# Patient Record
Sex: Male | Born: 1950 | Race: White | Hispanic: No | Marital: Married | Smoking: Never smoker
Health system: Southern US, Community
[De-identification: ages and names within clinical notes are randomized; demographics above are authoritative.]

## PROBLEM LIST (undated history)

## (undated) DIAGNOSIS — E7800 Pure hypercholesterolemia, unspecified: Secondary | ICD-10-CM

## (undated) HISTORY — PX: CORONARY ANGIOPLASTY WITH STENT PLACEMENT: SHX49

## (undated) HISTORY — DX: Pure hypercholesterolemia, unspecified: E78.00

## (undated) HISTORY — PX: VEIN BYPASS SURGERY: SHX833

---

## 2011-03-19 ENCOUNTER — Other Ambulatory Visit (HOSPITAL_COMMUNITY): Payer: Self-pay | Admitting: Specialist

## 2011-03-19 ENCOUNTER — Ambulatory Visit (HOSPITAL_COMMUNITY)
Admission: RE | Admit: 2011-03-19 | Discharge: 2011-03-19 | Disposition: A | Discharge status: Home / Self Care | Payer: Worker's Compensation | Source: Ambulatory Visit | Attending: Specialist | Admitting: Specialist

## 2011-03-19 DIAGNOSIS — M25519 Pain in unspecified shoulder: Secondary | ICD-10-CM | POA: Insufficient documentation

## 2019-09-01 ENCOUNTER — Ambulatory Visit (INDEPENDENT_AMBULATORY_CARE_PROVIDER_SITE_OTHER): Payer: Medicare Other | Admitting: Orthopaedic Surgery

## 2019-09-01 ENCOUNTER — Other Ambulatory Visit: Payer: Self-pay

## 2019-09-01 ENCOUNTER — Ambulatory Visit: Payer: Self-pay

## 2019-09-01 ENCOUNTER — Encounter: Payer: Self-pay | Admitting: Orthopaedic Surgery

## 2019-09-01 VITALS — Ht 65.0 in | Wt 185.0 lb

## 2019-09-01 DIAGNOSIS — M25562 Pain in left knee: Secondary | ICD-10-CM

## 2019-09-01 DIAGNOSIS — G8929 Other chronic pain: Secondary | ICD-10-CM | POA: Diagnosis not present

## 2019-09-01 DIAGNOSIS — M170 Bilateral primary osteoarthritis of knee: Secondary | ICD-10-CM | POA: Diagnosis not present

## 2019-09-01 NOTE — Progress Notes (Signed)
Office Visit Note   Patient: Jonathan Moran           Date of Birth: 1950-06-19           MRN: [id number] Visit Date: 09/01/2019              Requested by: No referring provider defined for this encounter. PCP: No primary care provider on file.   Assessment & Plan: Visit Diagnoses:  1. Chronic pain of left knee   2. Bilateral primary osteoarthritis of knee     Plan: Mr. Decock relates having pain in both knees his history is well outlined below.  He said Workmen's Comp. injuries bilaterally but many years ago.  He has evidence of osteoarthritis on plain film and I suspect that is the cause of his present pain.  I have discussed use of NSAIDs, braces, Voltaren gel and cortisone and/or viscosupplementation.  Has had cortisone in the past without much relief.  I do not think he be a good candidate for total knee replacement at this point but certainly something to think about.  I have given him a lot of information is going to go home and think about all the above and let us know if he wants to proceed with further nonoperative treatment or even consider an MRI scan of his left knee which is the more symptomatic.  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  His biggest problem is typically getting up and down from a squatting position.  Also have discussed physical therapy and strengthening exercises  Follow-Up Instructions: Return if symptoms worsen or fail to improve.   Orders:  Orders Placed This Encounter  Procedures  . XR KNEE 3 VIEW LEFT   No orders of the defined types were placed in this encounter.     Procedures: No procedures performed   Clinical Data: No additional findings.   Subjective: Chief Complaint  Patient presents with  . Left Knee - Pain  Patient presents today for left knee pain. He said that he injured his left knee at work 5 years ago. He slipped and fell getting off a loader and landed on his left knee. After imaging  at the time it was recommended to have his knee scoped and cleaned out, but it was never done. His pain is located medially. The pain is not constant, but seems to hurt more when walking on uneven ground. It does not give way, swell, or grind. He takes Ibuprofen for pain. Patient wants to get back to hiking.  Had a cortisone injection years ago without much relief.  Also had an injury to his right knee about 6 years ago that was Workmen's Comp. related.  He is not sure that it was ever diagnosed but "never had surgery".  In the interim he developed a problem with his back and was treated nonoperatively as well. He is semiretired and works with heavy equipment and does not appear to have much compromise of his activities.  He does have difficulty with deep knee bends and squats.  He specifically does not have a sensation of his knee giving way but oftentimes will have a feeling like a "ice pick" in one of the other knee.  He has worn knee braces which may provide some relief of his discomfort.  He is not had any effusion.  HPI  Review of Systems   Objective: Vital Signs: Ht 5\' 5"  (1.651 m)   Wt 185 lb (  83.9 kg)   BMI 30.79 kg/m   Physical Exam Constitutional:      Appearance: He is well-developed.  Eyes:     Pupils: Pupils are equal, round, and reactive to light.  Pulmonary:     Effort: Pulmonary effort is normal.  Skin:    General: Skin is warm and dry.  Neurological:     Mental Status: He is alert and oriented to person, place, and time.  Psychiatric:        Behavior: Behavior normal.     Ortho Exam awake alert and oriented x3.  Comfortable sitting.  Stiff related to his back when he gets up from a sitting position but does not appear to limp in reference to either knee.  The knees were not hot warm or swollen.  No effusion.  He had anterior medial joint pain bilaterally and some patella crepitation and minimal pain with patella compression.  He had little difficulty with deep knee  bends and squats in the office.  No popliteal mass or pain.  No calf pain.  No distal edema.  Full extension of flexed over 105 degrees.  No opening with a varus or valgus stress in either knee.  The left knee had a positive anterior drawer sign and positive Lachman's.  I would not be surprised if he had an injury to his ACL many years ago but is not having any sensation of his knee giving way. Specialty Comments:  No specialty comments available.  Imaging: XR KNEE 3 VIEW LEFT  Result Date: 09/01/2019 Films of both knees were obtained in the AP and lateral projections with patella views.  On the left there is narrowing of the medial joint space with mild subchondral sclerosis and minimal osteophyte formation.  No ectopic calcification.  There is some narrowing of the lateral patellofemoral articulation.  No vascular calcification.  Alignment is neutral.  Right knee films were included in the series and are similar to the left knee with some narrowing of the medial joint space with subchondral sclerosis and a very small peripheral proximal tibial subchondral cyst.  Films are consistent with osteoarthritis    PMFS History: Patient Active Problem List   Diagnosis Date Noted  . Bilateral primary osteoarthritis of knee 09/01/2019   Past Medical History:  Diagnosis Date  . High cholesterol     No family history on file.  Past Surgical History:  Procedure Laterality Date  . CORONARY ANGIOPLASTY WITH STENT PLACEMENT    . VEIN BYPASS SURGERY     Social History   Occupational History  . Not on file  Tobacco Use  . Smoking status: Never Smoker  . Smokeless tobacco: Never Used  Substance and Sexual Activity  . Alcohol use: Never  . Drug use: Never  . Sexual activity: Not on file

## 2019-09-11 ENCOUNTER — Other Ambulatory Visit: Payer: Self-pay | Admitting: Radiology

## 2019-09-11 ENCOUNTER — Other Ambulatory Visit: Payer: Self-pay | Admitting: Orthopaedic Surgery

## 2019-09-11 ENCOUNTER — Telehealth: Payer: Self-pay | Admitting: Radiology

## 2019-09-11 DIAGNOSIS — G8929 Other chronic pain: Secondary | ICD-10-CM

## 2019-09-11 DIAGNOSIS — Z77018 Contact with and (suspected) exposure to other hazardous metals: Secondary | ICD-10-CM

## 2019-09-11 NOTE — Telephone Encounter (Signed)
Thank you :)

## 2019-09-11 NOTE — Telephone Encounter (Signed)
Patient left voicemail on Sabrina's phone requesting MRI.  I have placed order for left knee MRI per Dr. Serita Grit last note.

## 2019-10-02 ENCOUNTER — Ambulatory Visit
Admission: RE | Admit: 2019-10-02 | Discharge: 2019-10-02 | Disposition: A | Discharge status: Home / Self Care | Payer: Medicare Other | Source: Ambulatory Visit | Attending: Orthopaedic Surgery | Admitting: Orthopaedic Surgery

## 2019-10-02 ENCOUNTER — Other Ambulatory Visit: Payer: Self-pay

## 2019-10-02 DIAGNOSIS — G8929 Other chronic pain: Secondary | ICD-10-CM

## 2019-10-02 DIAGNOSIS — Z77018 Contact with and (suspected) exposure to other hazardous metals: Secondary | ICD-10-CM

## 2019-10-02 DIAGNOSIS — M25562 Pain in left knee: Secondary | ICD-10-CM

## 2019-10-06 ENCOUNTER — Ambulatory Visit: Payer: Medicare Other | Admitting: Orthopaedic Surgery

## 2019-10-06 ENCOUNTER — Other Ambulatory Visit: Payer: Self-pay

## 2019-10-06 ENCOUNTER — Encounter: Payer: Self-pay | Admitting: Orthopaedic Surgery

## 2019-10-06 DIAGNOSIS — M25561 Pain in right knee: Secondary | ICD-10-CM | POA: Insufficient documentation

## 2019-10-06 DIAGNOSIS — G8929 Other chronic pain: Secondary | ICD-10-CM | POA: Diagnosis not present

## 2019-10-06 DIAGNOSIS — M25562 Pain in left knee: Secondary | ICD-10-CM | POA: Diagnosis not present

## 2019-10-06 NOTE — Progress Notes (Signed)
Office Visit Note   Patient: Jonathan Moran           Date of Birth: 1950/08/28           MRN: [id number] Visit Date: 10/06/2019              Requested by: No referring provider defined for this encounter. PCP: Katherine Basset, PA-C   Assessment & Plan: Visit Diagnoses:  1. Chronic pain of left knee     Plan: Mr. Fullen relates that he had an on-the-job injury about 4 to 5 years ago to his left knee.  He was treated conservatively.  He notes that he has had recurrent episodes of pain popping and clicking particularly along the medial compartment.  Accordingly, I ordered an MRI scan.  The scan reveals a large horizontal tear through the posterior horn and body of the medial meniscus.  The body is degenerative diminutive and extruded peripherally.  He also has a horizontal tear in the posterior horn of the lateral meniscus and a second horizontal tear in the mid body.  He had minimal degeneration patellofemoral joint and mild degeneration in both medial lateral compartments without popliteal cyst.  No bony acute problems or stress reaction.  Long discussion regarding diagnosis and treatment options.  I would suggest scopic debridement of the medial lateral menisci to evaluate the arthritis.  He certainly has some arthritis in that knee caused him to have some residual discomfort but given his activity level I think the arthroscopy would be his best bet.  He is fully aware that this may not provide complete relief.  He relates he has a lot of activities that he has to complete before considering the surgery and will call Debbie when he is ready to schedule  Discussed with surgery, outpatient nature what he may expect postoperatively as well as some of the potential complications and pain.  Follow-Up Instructions: Return if symptoms worsen or fail to improve.   Orders:  No orders of the defined types were placed in this encounter.  No orders of the defined types were placed in this  encounter.     Procedures: No procedures performed   Clinical Data: No additional findings.   Subjective: Chief Complaint  Patient presents with  . Left Knee - Follow-up    MRI review  Patient presents today for follow up on his left knee. He had an MRI done on 10/02/2019. No changes since his last visit.   HPI  Review of Systems  Constitutional: Negative for fatigue.  HENT: Negative for ear pain.   Eyes: Negative for pain.  Respiratory: Negative for shortness of breath.   Cardiovascular: Negative for leg swelling.  Gastrointestinal: Negative for constipation and diarrhea.  Endocrine: Negative for cold intolerance and heat intolerance.  Genitourinary: Negative for difficulty urinating.  Musculoskeletal: Negative for joint swelling.  Skin: Negative for rash.  Allergic/Immunologic: Negative for food allergies.  Neurological: Negative for weakness.  Hematological: Does not bruise/bleed easily.  Psychiatric/Behavioral: Negative for sleep disturbance.     Objective: Vital Signs: Ht 5\' 5"  (1.651 m)   Wt 185 lb (83.9 kg)   BMI 30.79 kg/m   Physical Exam Constitutional:      Appearance: He is well-developed.  Eyes:     Pupils: Pupils are equal, round, and reactive to light.  Pulmonary:     Effort: Pulmonary effort is normal.  Skin:    General: Skin is warm and dry.  Neurological:     Mental Status: He  is alert and oriented to person, place, and time.  Psychiatric:        Behavior: Behavior normal.     Ortho Exam left knee was not hot red warm or swollen. Full extension.  No effusion.  No instability.  No popliteal mass or pain.  No calf pain.  No distal edema.  He did have medial and lateral joint pain but more so in the lateral.  I could not discharge positive medial margins.  No instability with an anterior drawer sign.  Very mild patella crepitation but no particular pain.  He did have a small plica that was minimally uncomfortable.  Specialty Comments:  No  specialty comments available.  Imaging: No results found.   PMFS History: Patient Active Problem List   Diagnosis Date Noted  . Pain in left knee 10/06/2019  . Bilateral primary osteoarthritis of knee 09/01/2019   Past Medical History:  Diagnosis Date  . High cholesterol     History reviewed. No pertinent family history.  Past Surgical History:  Procedure Laterality Date  . CORONARY ANGIOPLASTY WITH STENT PLACEMENT    . VEIN BYPASS SURGERY     Social History   Occupational History  . Not on file  Tobacco Use  . Smoking status: Never Smoker  . Smokeless tobacco: Never Used  Substance and Sexual Activity  . Alcohol use: Never  . Drug use: Never  . Sexual activity: Not on file

## 2019-11-30 ENCOUNTER — Telehealth: Payer: Self-pay

## 2019-11-30 ENCOUNTER — Telehealth: Payer: Self-pay | Admitting: Orthopaedic Surgery

## 2019-11-30 NOTE — Telephone Encounter (Signed)
Patient would like to proceed with knee surgery. He states the right knee is bothering him more, so he would like to start with right knee first.  Please provide surgery sheet.

## 2019-11-30 NOTE — Telephone Encounter (Signed)
patient called he wants to follow through with having surgery on the right leg in January and surgery of left leg in February. [phone]

## 2019-12-01 NOTE — Telephone Encounter (Signed)
Completed.

## 2019-12-01 NOTE — Telephone Encounter (Signed)
Placed in folder for you to fill out.

## 2020-01-19 ENCOUNTER — Telehealth: Payer: Self-pay | Admitting: Orthopaedic Surgery

## 2020-01-19 NOTE — Telephone Encounter (Signed)
called

## 2020-01-19 NOTE — Telephone Encounter (Signed)
Patient called to reschedule his right knee surgery due to the potential threat of ice and snow this week.  Patient has to drive from [place] and roads are still bad there.  Patient has moved from  Jan 20th to Feb 3rd at [facility].  Please call patient and advise if he will be on crutches or in need of walker.   pts cb  [phone]

## 2020-01-19 NOTE — Telephone Encounter (Signed)
Please advise. Thanks.  

## 2020-01-28 ENCOUNTER — Inpatient Hospital Stay: Payer: Medicare Other | Admitting: Orthopaedic Surgery

## 2020-02-02 ENCOUNTER — Telehealth: Payer: Self-pay | Admitting: Orthopaedic Surgery

## 2020-02-02 NOTE — Telephone Encounter (Signed)
Please advise 

## 2020-02-02 NOTE — Telephone Encounter (Signed)
PT called wanting to know if Dr.Whitfield could give him an estimate of when he'll be able to start putting weight back on his knee after surgery? Pt would like a CB to discuss  [phone]

## 2020-02-02 NOTE — Telephone Encounter (Signed)
called

## 2020-02-04 ENCOUNTER — Other Ambulatory Visit: Payer: Self-pay | Admitting: Orthopaedic Surgery

## 2020-02-04 DIAGNOSIS — M23251 Derangement of posterior horn of lateral meniscus due to old tear or injury, right knee: Secondary | ICD-10-CM

## 2020-02-04 DIAGNOSIS — M23221 Derangement of posterior horn of medial meniscus due to old tear or injury, right knee: Secondary | ICD-10-CM

## 2020-02-04 MED ORDER — HYDROCODONE-ACETAMINOPHEN 5-325 MG PO TABS: 1.0000 | ORAL_TABLET | Freq: Four times a day (QID) | ORAL | 0 refills | Status: AC | PRN

## 2020-02-05 ENCOUNTER — Telehealth: Payer: Self-pay

## 2020-02-05 NOTE — Telephone Encounter (Signed)
called

## 2020-02-05 NOTE — Telephone Encounter (Signed)
Patients son called he stated his father was prescribed hydrocodone which isn't touching the pain he stated his dad has been up all night and is wondering if something different can be prescribed or a higher dosage of hydrocodone. [phone]

## 2020-02-05 NOTE — Telephone Encounter (Signed)
See below

## 2020-02-11 ENCOUNTER — Encounter: Payer: Self-pay | Admitting: Orthopaedic Surgery

## 2020-02-11 ENCOUNTER — Ambulatory Visit (INDEPENDENT_AMBULATORY_CARE_PROVIDER_SITE_OTHER): Payer: Medicare Other | Admitting: Orthopaedic Surgery

## 2020-02-11 ENCOUNTER — Other Ambulatory Visit: Payer: Self-pay

## 2020-02-11 VITALS — Ht 65.0 in | Wt 185.0 lb

## 2020-02-11 DIAGNOSIS — G8929 Other chronic pain: Secondary | ICD-10-CM

## 2020-02-11 DIAGNOSIS — M25561 Pain in right knee: Secondary | ICD-10-CM

## 2020-02-11 NOTE — Progress Notes (Signed)
   Office Visit Note   Patient: Jonathan Moran           Date of Birth: Mar 16, 1950           MRN: [id number] Visit Date: 02/11/2020              Requested by: Katherine Basset, PA-C [address] [place],  [place] [address] PCP: Katherine Basset, PA-C   Assessment & Plan: Visit Diagnoses:  1. Chronic pain of right knee     Plan: 1 week status post right knee arthroscopy.  There were tears of the medial lateral menisci which were debrided.  Also some areas of grade 4 arthritis in the medial compartment.  Actually doing well.  Using a walker for ambulation but feels confident he could progress to a cane without a problem.  Taking very little Vicodin.  We will plan to see him back in 2 weeks.  He will work on range of motion exercises of bear weight to tolerance.  At this point I do not think he will need therapy  Follow-Up Instructions: Return in about 2 weeks (around 02/25/2020).   Orders:  No orders of the defined types were placed in this encounter.  No orders of the defined types were placed in this encounter.     Procedures: No procedures performed   Clinical Data: No additional findings.   Subjective: Chief Complaint  Patient presents with  . Right Knee - Follow-up    Right knee arthroscopy 02/04/2020  Patient presents today for follow up on his right knee. He had a right knee arthroscopy with medial and lateral meniscectomy and chondroplasty of the medial femoral condyle. His surgery was on 02/04/2020, and he is now one week out from surgery. Patient states that he is finally starting to feel better. He had intense pain when his block wore off and had a difficult time catching up. He has finished taking his pain medicine. He is walking with a walker for support.   HPI  Review of Systems   Objective: Vital Signs: Ht 5\' 5"  (1.651 m)   Wt 185 lb (83.9 kg)   BMI 30.79 kg/m   Physical Exam  Ortho Exam right knee was not hot red or warm minimally swollen and  minimal effusion.  Arthroscopic portals are healing without problem.  No distal edema.  No calf pain.  Full extension flexed at least 95 to 100 degrees  Specialty Comments:  No specialty comments available.  Imaging: No results found.   PMFS History: Patient Active Problem List   Diagnosis Date Noted  . Pain in left knee 10/06/2019  . Bilateral primary osteoarthritis of knee 09/01/2019   Past Medical History:  Diagnosis Date  . High cholesterol     History reviewed. No pertinent family history.  Past Surgical History:  Procedure Laterality Date  . CORONARY ANGIOPLASTY WITH STENT PLACEMENT    . VEIN BYPASS SURGERY     Social History   Occupational History  . Not on file  Tobacco Use  . Smoking status: Never Smoker  . Smokeless tobacco: Never Used  Substance and Sexual Activity  . Alcohol use: Never  . Drug use: Never  . Sexual activity: Not on file

## 2020-03-02 ENCOUNTER — Ambulatory Visit: Payer: Medicare Other | Admitting: Orthopaedic Surgery

## 2020-03-02 ENCOUNTER — Encounter: Payer: Self-pay | Admitting: Orthopaedic Surgery

## 2020-03-02 ENCOUNTER — Other Ambulatory Visit: Payer: Self-pay

## 2020-03-02 VITALS — Ht 65.0 in | Wt 185.0 lb

## 2020-03-02 DIAGNOSIS — M170 Bilateral primary osteoarthritis of knee: Secondary | ICD-10-CM

## 2020-03-02 DIAGNOSIS — G8929 Other chronic pain: Secondary | ICD-10-CM

## 2020-03-02 DIAGNOSIS — M25562 Pain in left knee: Secondary | ICD-10-CM | POA: Diagnosis not present

## 2020-03-02 MED ORDER — LIDOCAINE HCL 1 % IJ SOLN
2.0000 mL | INTRAMUSCULAR | Status: AC | PRN
  Administered 2020-03-02: 2 mL

## 2020-03-02 NOTE — Progress Notes (Signed)
Office Visit Note   Patient: Jonathan Moran           Date of Birth: May 30, 1950           MRN: [id number] Visit Date: 03/02/2020              Requested by: Katherine Basset, PA-C [address] [place],  [place] [address] PCP: Katherine Basset, PA-C   Assessment & Plan: Visit Diagnoses:  1. Chronic pain of left knee   2. Bilateral primary osteoarthritis of knee     Plan: 1 month status post right knee arthroscopy with partial medial lateral meniscectomy.  Also noted to have some arthritic changes particularly in the medial compartment.  Has had some tightness and stiffness of his knee which is probably related to the arthritis but also related to an effusion.  I aspirated 40 cc of blood-tinged fluid with considerable relief of his pain and increased range of motion.  Have encouraged him to work with range of motion exercises and return in a month if no improvement.  Consider cortisone injection at that time  Follow-Up Instructions: Return if symptoms worsen or fail to improve.   Orders:  Orders Placed This Encounter  Procedures  . Large Joint Inj: R knee   No orders of the defined types were placed in this encounter.     Procedures: Large Joint Inj: R knee on 03/02/2020 10:05 AM Indications: pain and diagnostic evaluation Details: 25 G 1.5 in needle, anteromedial approach  Arthrogram: No  Medications: 2 mL lidocaine 1 % Aspirate: 40 mL blood-tinged Outcome: tolerated well, no immediate complications Procedure, treatment alternatives, risks and benefits explained, specific risks discussed. Consent was given by the patient. Immediately prior to procedure a time out was called to verify the correct patient, procedure, equipment, support staff and site/side marked as required. Patient was prepped and draped in the usual sterile fashion.       Clinical Data: No additional findings.   Subjective: Chief Complaint  Patient presents with  . Right Knee - Follow-up     Right knee arthroscopy 02/04/2020  Patient presents today for follow up on his right knee. He had a right knee arthroscopy on 02/04/2020. He is now 4 weeks out from surgery. Patient states that he is doing okay. He has stiffness in his knee with any prolonged sitting or standing. He states that the medial incision is still very sore. He is not taking anything for pain.  HPI  Review of Systems   Objective: Vital Signs: Ht 5\' 5"  (1.651 m)   Wt 185 lb (83.9 kg)   BMI 30.79 kg/m   Physical Exam  Ortho Exam positive effusion right knee.  No particular increase in warmth.  No instability.  Minimal medial joint pain anteriorly.  Full extension flex to over 105 degrees without instability  Specialty Comments:  No specialty comments available.  Imaging: No results found.   PMFS History: Patient Active Problem List   Diagnosis Date Noted  . Pain in left knee 10/06/2019  . Bilateral primary osteoarthritis of knee 09/01/2019   Past Medical History:  Diagnosis Date  . High cholesterol     History reviewed. No pertinent family history.  Past Surgical History:  Procedure Laterality Date  . CORONARY ANGIOPLASTY WITH STENT PLACEMENT    . VEIN BYPASS SURGERY     Social History   Occupational History  . Not on file  Tobacco Use  . Smoking status: Never Smoker  . Smokeless tobacco: Never  Used  Substance and Sexual Activity  . Alcohol use: Never  . Drug use: Never  . Sexual activity: Not on file

## 2020-04-21 ENCOUNTER — Other Ambulatory Visit: Payer: Self-pay

## 2020-04-21 ENCOUNTER — Encounter: Payer: Self-pay | Admitting: Orthopaedic Surgery

## 2020-04-21 ENCOUNTER — Ambulatory Visit (INDEPENDENT_AMBULATORY_CARE_PROVIDER_SITE_OTHER): Payer: Medicare Other | Admitting: Orthopaedic Surgery

## 2020-04-21 VITALS — Ht 65.0 in | Wt 185.0 lb

## 2020-04-21 DIAGNOSIS — M170 Bilateral primary osteoarthritis of knee: Secondary | ICD-10-CM

## 2020-04-21 DIAGNOSIS — G8929 Other chronic pain: Secondary | ICD-10-CM

## 2020-04-21 DIAGNOSIS — M25561 Pain in right knee: Secondary | ICD-10-CM

## 2020-04-21 NOTE — Progress Notes (Signed)
   Office Visit Note   Patient: Jonathan Moran           Date of Birth: 1950-05-15           MRN: [id number] Visit Date: 04/21/2020              Requested by: Katherine Basset, PA-C [address] [place],  [place] [address] PCP: Katherine Basset, PA-C   Assessment & Plan: Visit Diagnoses:  1. Chronic pain of right knee   2. Bilateral primary osteoarthritis of knee     Plan: Jonathan Moran is 2 and half months status post right knee arthroscopy with partial meniscectomy and debridement of medial compartment arthritis.  Actually doing fairly well.  Does have some limitations with relative to the arthritis but happy with the results.  He does not use any ambulatory aid or take any pain pills.  Long discussion regarding exercises for strengthening and maybe even wearing a pullover knee support as needed.  We will plan to see him back as needed.  He is fully aware that he will will have some limitations and occasional discomfort based on the arthritis  Follow-Up Instructions: Return if symptoms worsen or fail to improve.   Orders:  No orders of the defined types were placed in this encounter.  No orders of the defined types were placed in this encounter.     Procedures: No procedures performed   Clinical Data: No additional findings.   Subjective: Chief Complaint  Patient presents with  . Right Knee - Follow-up    Right knee arthroscopy 02/04/2020  Patient presents today for follow up on his right knee. He had a right knee arthroscopy on 02/04/2020. Patient states that it is very swollen medially. He has difficulty with stairs. He is not taking anything for pain. Minimal swelling per patient.  HPI  Review of Systems   Objective: Vital Signs: Ht 5\' 5"  (1.651 m)   Wt 185 lb (83.9 kg)   BMI 30.79 kg/m   Physical Exam  Ortho Exam right knee was not warm or hot.  No effusion.  Little bit of medial joint tenderness but no crepitation.  Full extension of flexed over 100  degrees without instability.  No calf pain.  No distal edema  Specialty Comments:  No specialty comments available.  Imaging: No results found.   PMFS History: Patient Active Problem List   Diagnosis Date Noted  . Pain in left knee 10/06/2019  . Bilateral primary osteoarthritis of knee 09/01/2019   Past Medical History:  Diagnosis Date  . High cholesterol     History reviewed. No pertinent family history.  Past Surgical History:  Procedure Laterality Date  . CORONARY ANGIOPLASTY WITH STENT PLACEMENT    . VEIN BYPASS SURGERY     Social History   Occupational History  . Not on file  Tobacco Use  . Smoking status: Never Smoker  . Smokeless tobacco: Never Used  Substance and Sexual Activity  . Alcohol use: Never  . Drug use: Never  . Sexual activity: Not on file

## 2020-04-27 ENCOUNTER — Encounter: Payer: Medicare Other | Admitting: Orthopaedic Surgery

## 2022-02-08 IMAGING — CR DG ORBITS FOR FOREIGN BODY
2 series · 2 of 2 positions shown · non-contrast
Comparison: 03/19/2011

CLINICAL DATA: Metal working/exposure; clearance prior to MRI

EXAM:
ORBITS FOR FOREIGN BODY - 2 VIEW

[w orbit pa (1 of 2)]
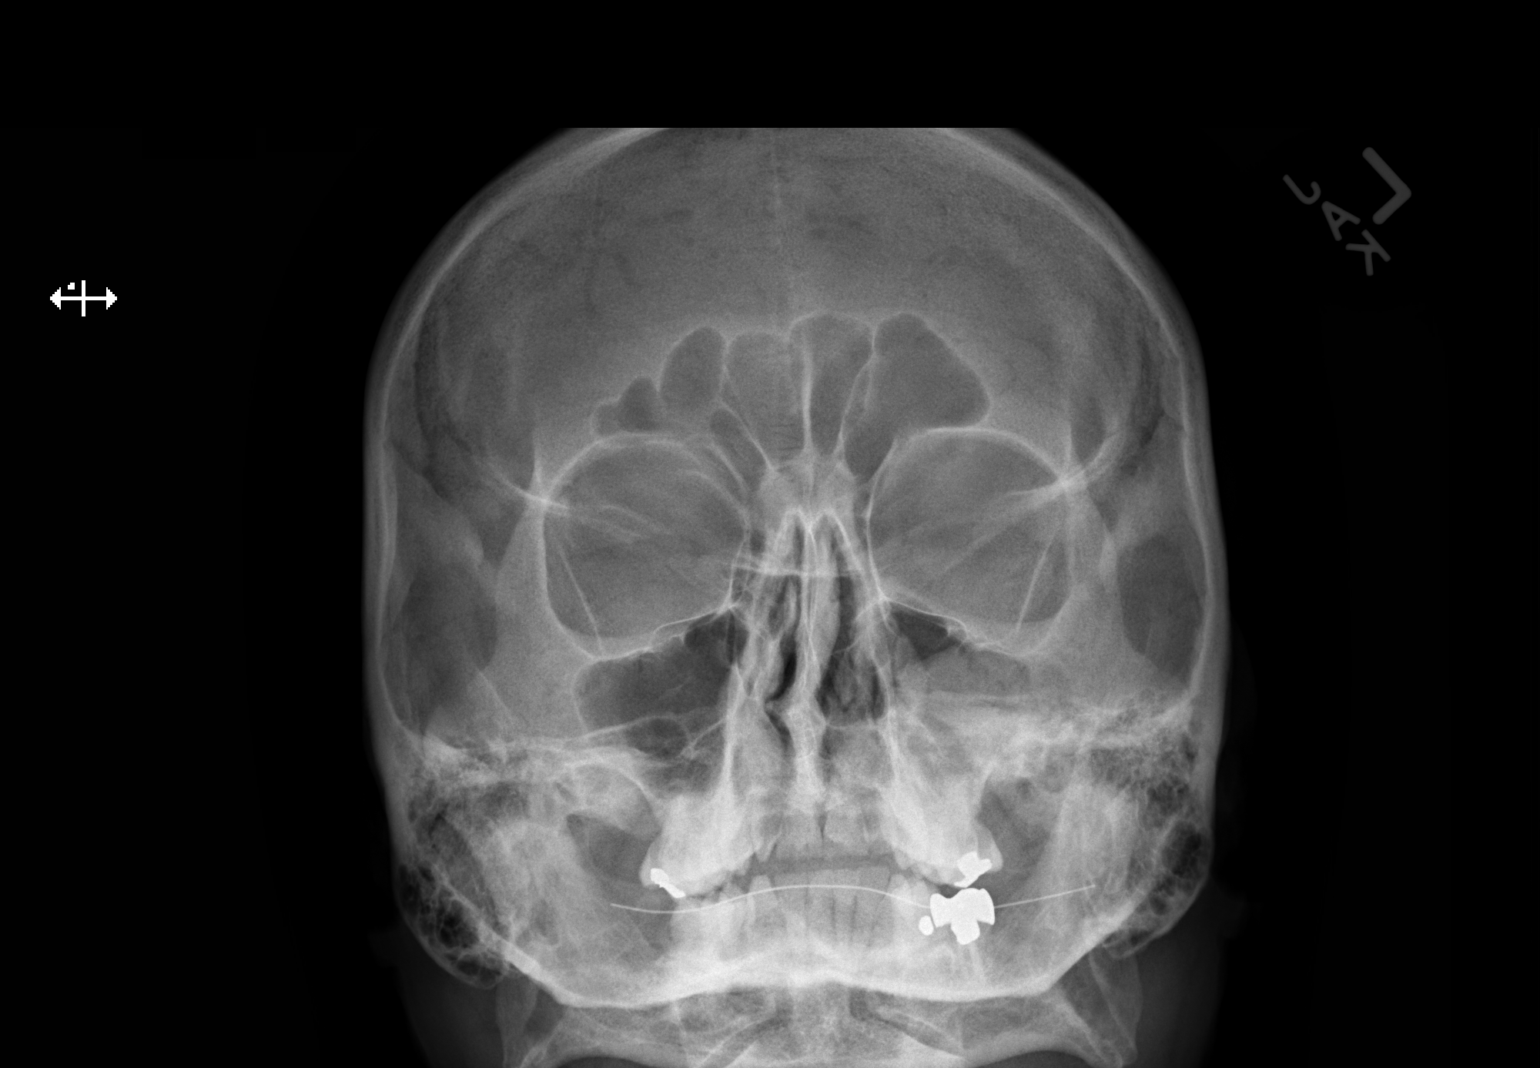

[w orbit pa (2 of 2)]
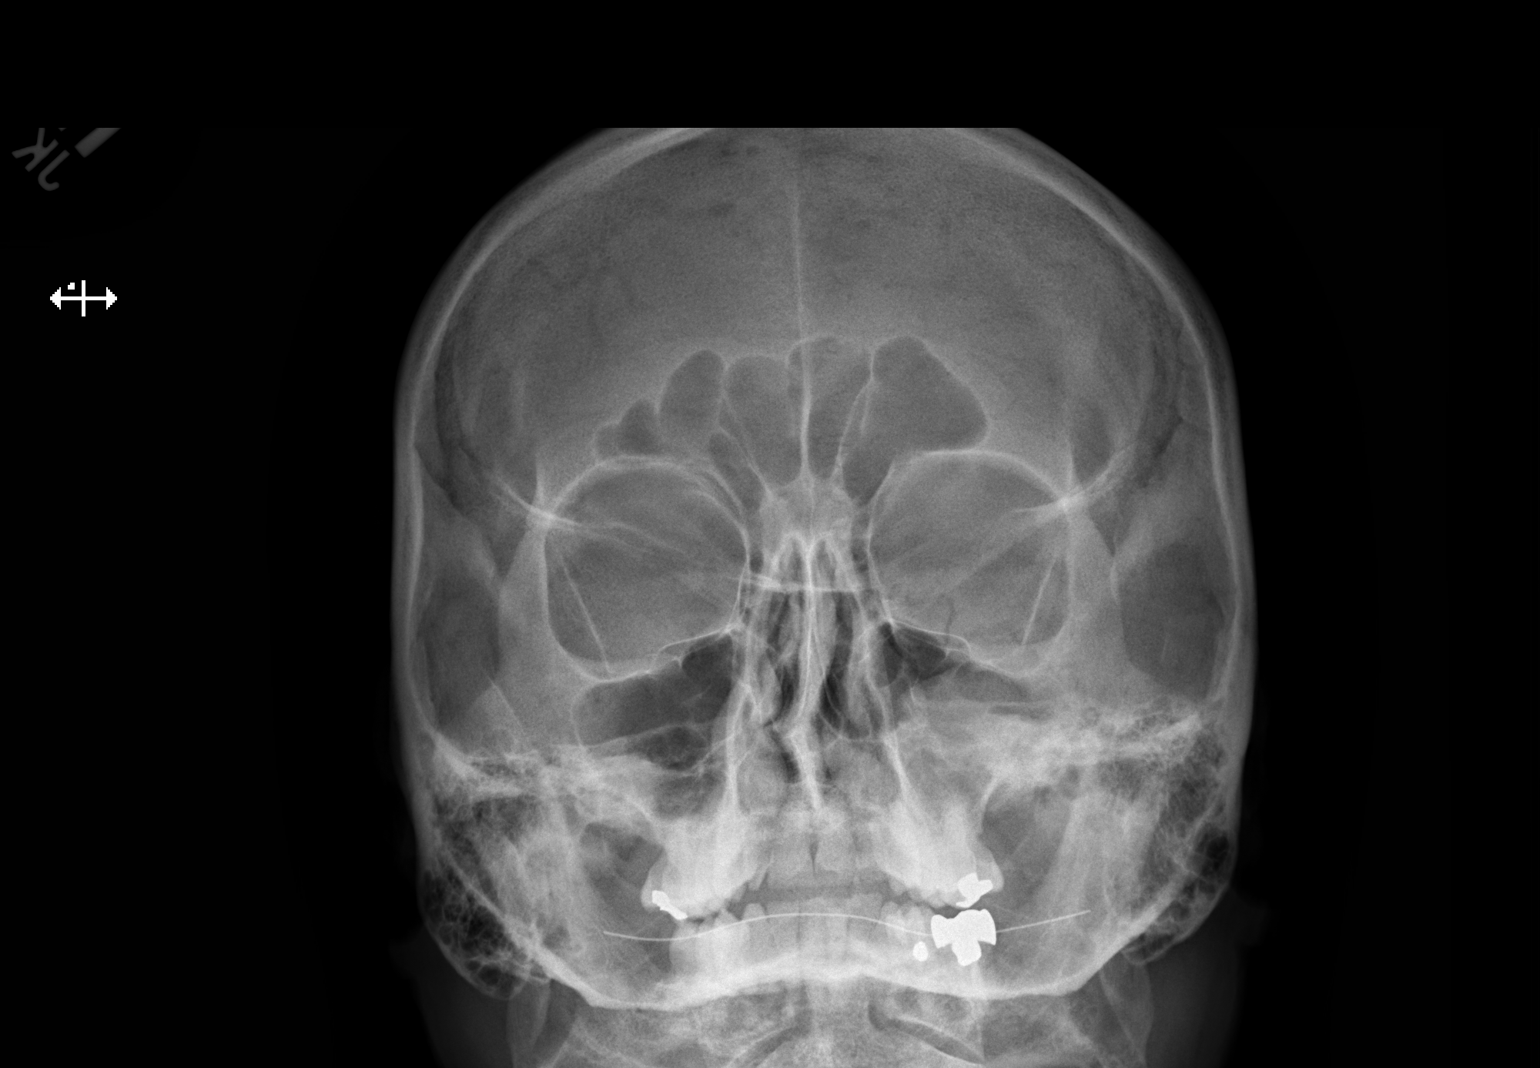

[2 of 2 positions shown; findings below may reference images not displayed]

FINDINGS: There is no evidence of metallic foreign body within the orbits. No
significant bone abnormality identified. Asymmetric opacification of
the left maxillary sinus.
IMPRESSION: No evidence of metallic foreign body within the orbits.

Partially opacified left maxillary sinus, new from 4204.

## 2022-02-08 IMAGING — MR MR KNEE*L* W/O CM
4 of 6 series · 25 of 40 positions shown · non-contrast
Comparison: None.

CLINICAL DATA: Chronic right knee pain.  No known injury.

EXAM:
MRI OF THE LEFT KNEE WITHOUT CONTRAST
TECHNIQUE: Multiplanar, multisequence MR imaging of the knee was performed. No
intravenous contrast was administered.

[Series 3: T2 fat-sat · axial · 4.0mm · 0.50mm/px · z∈[-46,+69]mm · 6 of 24 slices shown (1 of 2)]
[im 1/24]
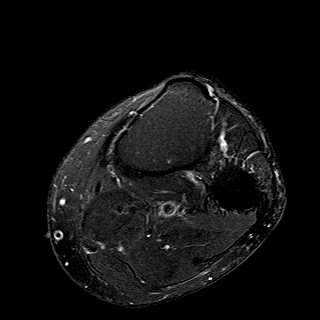
[im 5/24]
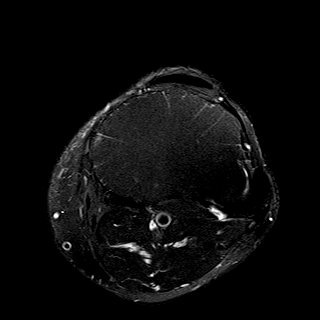
[im 10/24]
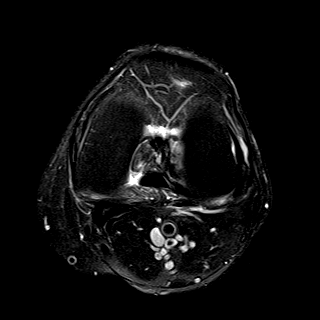
[im 14/24]
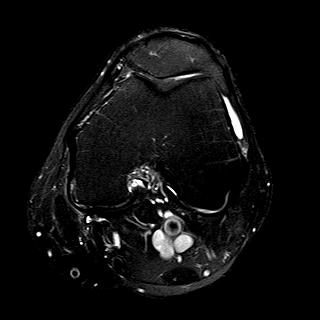
[im 19/24]
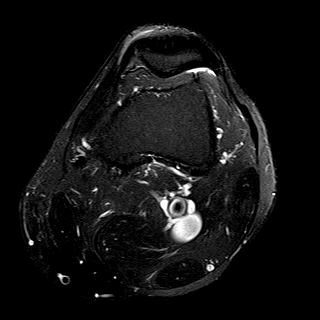
[im 24/24]
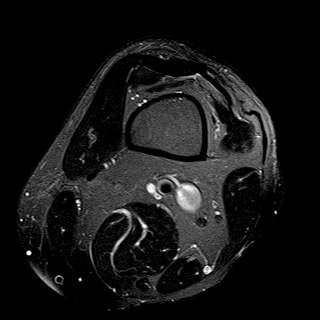

[Series 5: T2 fat-sat · coronal · 4.0mm · 0.29mm/px · 5 of 22 slices shown (2 of 2)]
[im 1/22]
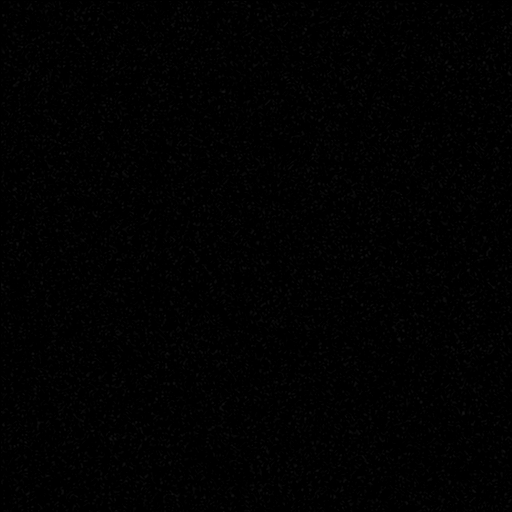
[im 5/22]
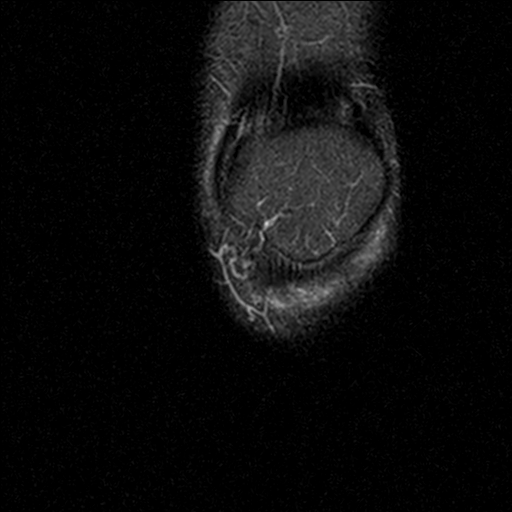
[im 9/22]
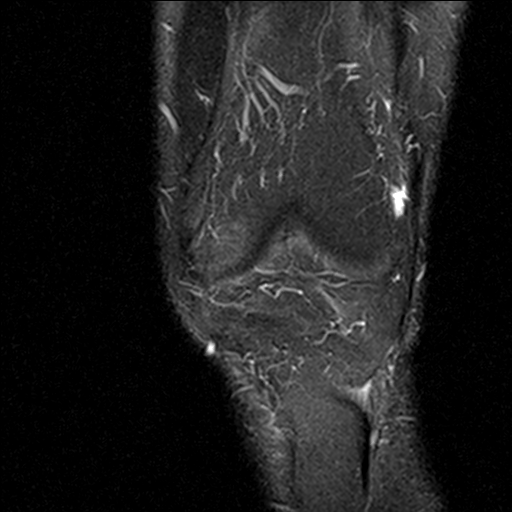
[im 13/22]
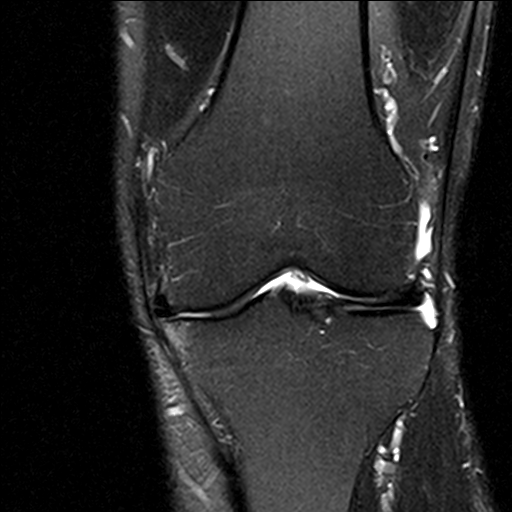
[im 22/22]
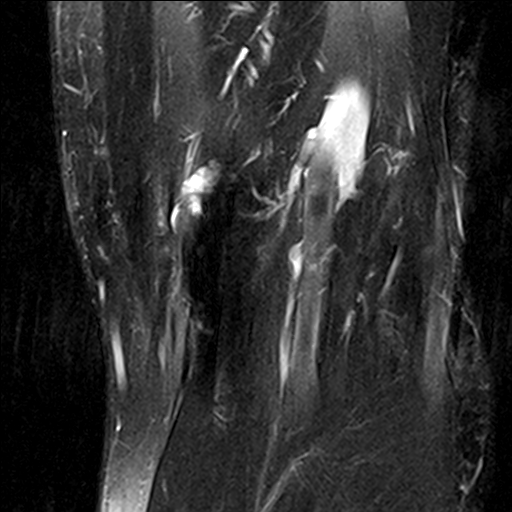

[Series 7: PD fat-sat · sagittal · 3.0mm · 0.29mm/px · 8 of 27 slices shown (1 of 2)]
[im 1/27]
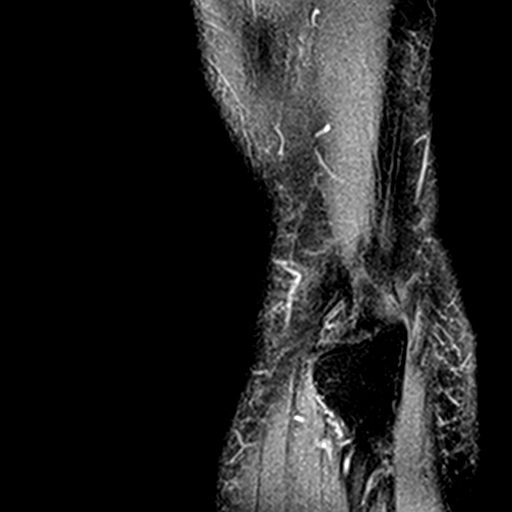
[im 4/27]
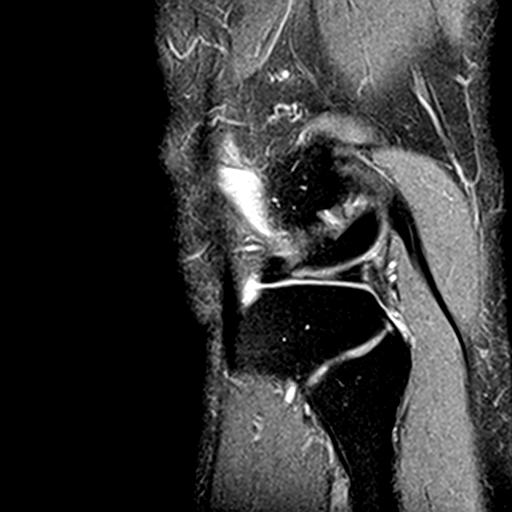
[im 8/27]
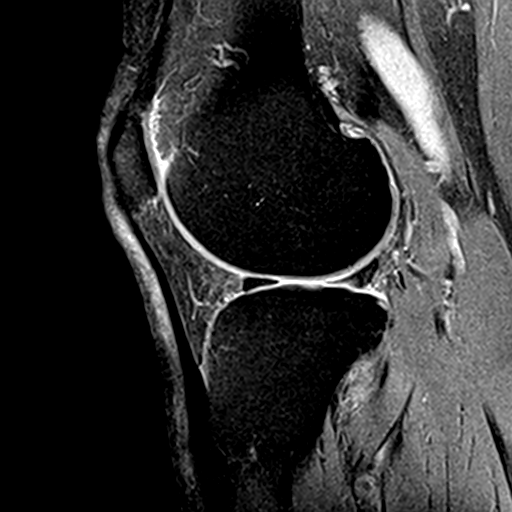
[im 12/27]
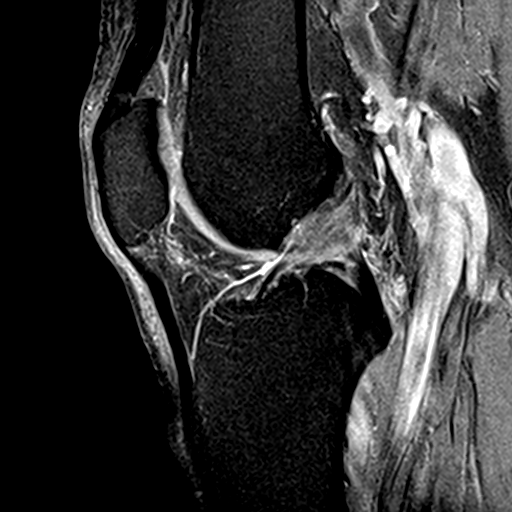
[im 15/27]
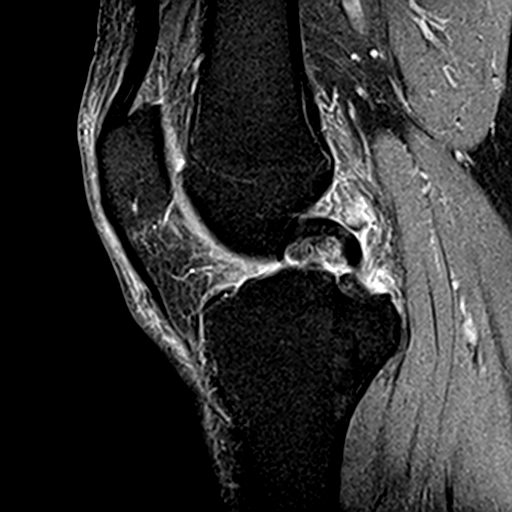
[im 19/27]
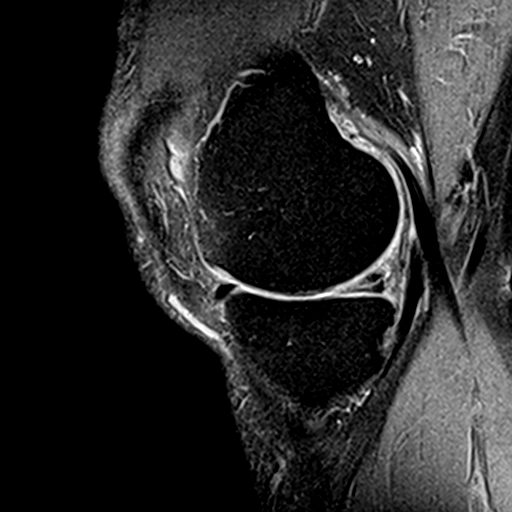
[im 23/27]
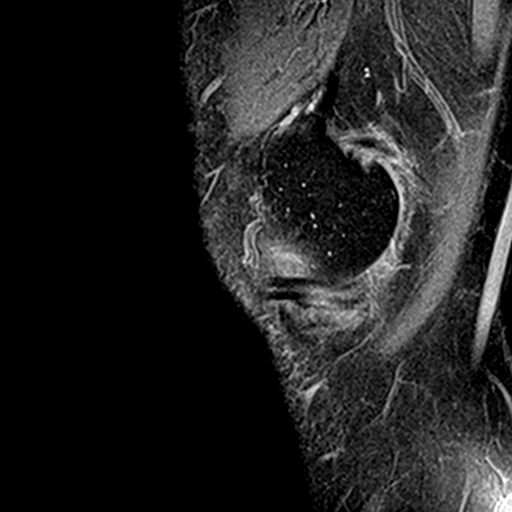
[im 27/27]
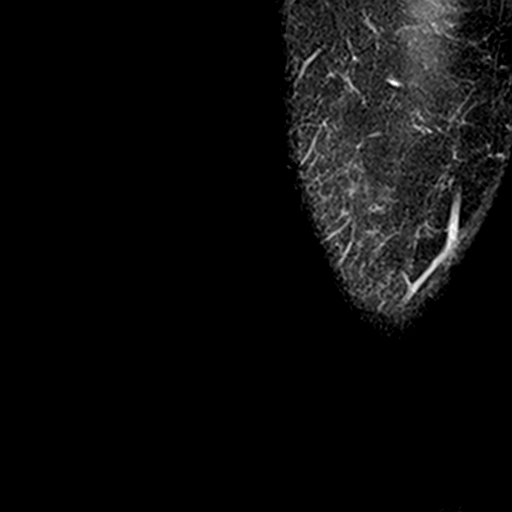

[Series 8: PD fat-sat · coronal · 4.0mm · 0.39mm/px · 6 of 22 slices shown (2 of 2)]
[im 1/22]
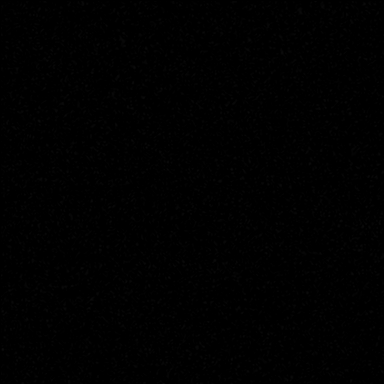
[im 5/22]
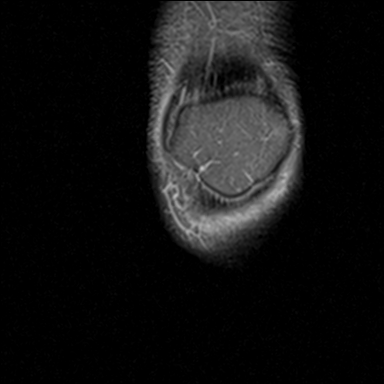
[im 9/22]
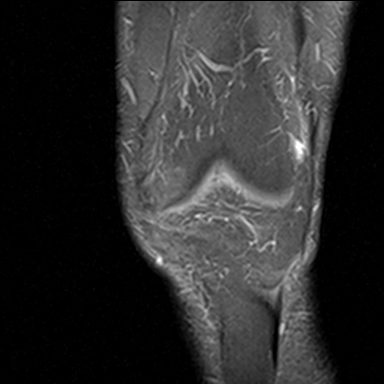
[im 13/22]
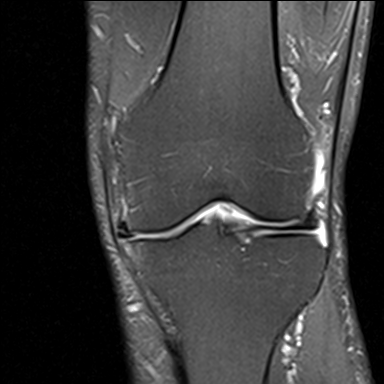
[im 17/22]
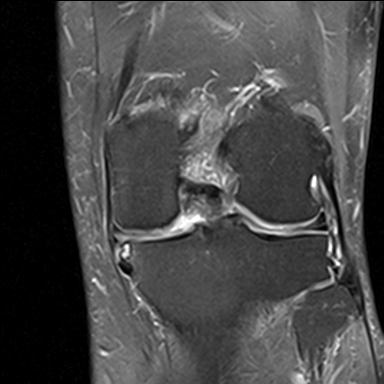
[im 22/22]
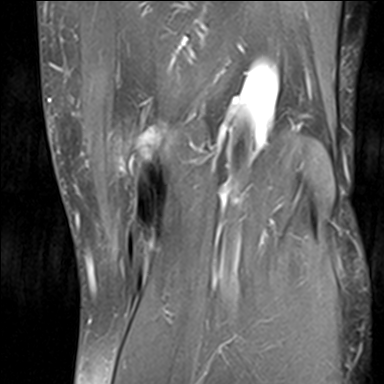

[25 of 40 positions shown; findings below may reference images not displayed]

FINDINGS: MENISCI

Medial meniscus: Large horizontal tear is seen throughout the
posterior horn and body. The body is degenerated, diminutive and
extruded peripherally.

Lateral meniscus: Horizontal tear is identified in the posterior
horn. There is a second horizontal tear in the midbody.

LIGAMENTS

Cruciates:  Intact.

Collaterals:  Intact.

CARTILAGE

Patellofemoral:  Minimally degenerated.

Medial:  Thinned without focal defect.

Lateral:  Mildly degenerated.

Joint:  Very small effusion.

Popliteal Fossa:  No Baker's cyst.

Extensor Mechanism:  Intact.

Bones: No fracture, stress change or worrisome lesion. Small
subchondral cysts are seen in the periphery of the medial tibia.

Other: None.
IMPRESSION: Tears of both the medial and lateral menisci as described above.

Mild-to-moderate medial and mild lateral compartment osteoarthritis.
